# Patient Record
Sex: Female | Born: 1980 | Race: Black or African American | Hispanic: No | Marital: Single | Smoking: Never smoker
Health system: Southern US, Community
[De-identification: ages and names within clinical notes are randomized; demographics above are authoritative.]

## PROBLEM LIST (undated history)

## (undated) ENCOUNTER — Inpatient Hospital Stay (HOSPITAL_COMMUNITY): Payer: Self-pay

## (undated) DIAGNOSIS — N83209 Unspecified ovarian cyst, unspecified side: Secondary | ICD-10-CM

## (undated) DIAGNOSIS — R519 Headache, unspecified: Secondary | ICD-10-CM

## (undated) DIAGNOSIS — R51 Headache: Secondary | ICD-10-CM

## (undated) HISTORY — PX: LAPAROSCOPY: SHX197

---

## 2009-01-04 ENCOUNTER — Emergency Department (HOSPITAL_BASED_OUTPATIENT_CLINIC_OR_DEPARTMENT_OTHER): Admission: EM | Admit: 2009-01-04 | Discharge: 2009-01-04 | Payer: Self-pay | Admitting: Emergency Medicine

## 2009-05-04 ENCOUNTER — Emergency Department (HOSPITAL_BASED_OUTPATIENT_CLINIC_OR_DEPARTMENT_OTHER): Admission: EM | Admit: 2009-05-04 | Discharge: 2009-05-04 | Payer: Self-pay | Admitting: Emergency Medicine

## 2011-11-16 ENCOUNTER — Other Ambulatory Visit (HOSPITAL_COMMUNITY)
Admission: RE | Admit: 2011-11-16 | Discharge: 2011-11-16 | Disposition: A | Discharge status: Home / Self Care | Payer: Self-pay | Source: Ambulatory Visit | Attending: Obstetrics & Gynecology | Admitting: Obstetrics & Gynecology

## 2011-11-16 DIAGNOSIS — Z124 Encounter for screening for malignant neoplasm of cervix: Secondary | ICD-10-CM | POA: Insufficient documentation

## 2011-11-16 DIAGNOSIS — R87810 Cervical high risk human papillomavirus (HPV) DNA test positive: Secondary | ICD-10-CM | POA: Insufficient documentation

## 2011-12-17 ENCOUNTER — Emergency Department (HOSPITAL_BASED_OUTPATIENT_CLINIC_OR_DEPARTMENT_OTHER)
Admission: EM | Admit: 2011-12-17 | Discharge: 2011-12-17 | Disposition: A | Discharge status: Home / Self Care | Payer: Self-pay | Attending: Emergency Medicine | Admitting: Emergency Medicine

## 2011-12-17 ENCOUNTER — Encounter (HOSPITAL_BASED_OUTPATIENT_CLINIC_OR_DEPARTMENT_OTHER): Payer: Self-pay | Admitting: *Deleted

## 2011-12-17 DIAGNOSIS — R51 Headache: Secondary | ICD-10-CM | POA: Insufficient documentation

## 2011-12-17 HISTORY — DX: Headache, unspecified: R51.9

## 2011-12-17 HISTORY — DX: Headache: R51

## 2011-12-17 LAB — PREGNANCY, URINE: Preg Test, Ur: NEGATIVE

## 2011-12-17 MED ORDER — KETOROLAC TROMETHAMINE 30 MG/ML IJ SOLN
30.0000 mg | Freq: Once | INTRAMUSCULAR | Status: AC
  Administered 2011-12-17: 30 mg via INTRAVENOUS
  Filled 2011-12-17: qty 1

## 2011-12-17 MED ORDER — METOCLOPRAMIDE HCL 5 MG/ML IJ SOLN
10.0000 mg | Freq: Once | INTRAMUSCULAR | Status: AC
  Administered 2011-12-17: 10 mg via INTRAVENOUS
  Filled 2011-12-17: qty 2

## 2011-12-17 MED ORDER — SODIUM CHLORIDE 0.9 % IV SOLN
Freq: Once | INTRAVENOUS | Status: AC
  Administered 2011-12-17: 19:00:00 via INTRAVENOUS

## 2011-12-17 MED ORDER — DIPHENHYDRAMINE HCL 50 MG/ML IJ SOLN
25.0000 mg | Freq: Once | INTRAMUSCULAR | Status: AC
  Administered 2011-12-17: 25 mg via INTRAVENOUS
  Filled 2011-12-17: qty 1

## 2011-12-17 NOTE — ED Notes (Signed)
D/c home with ride- pt states she feels better, pain 0/10, caox 4

## 2011-12-17 NOTE — ED Provider Notes (Signed)
Medical screening examination/treatment/procedure(s) were performed by non-physician practitioner and as supervising physician I was immediately available for consultation/collaboration.   Hance Caspers R Zylee Marchiano, MD 12/17/11 2335 

## 2011-12-17 NOTE — ED Notes (Signed)
Pt states she has a hx of headaches and this one just started this a.m. "Nose burning and I feel like I'm going to pass out"

## 2011-12-17 NOTE — ED Notes (Signed)
Pt c/o severe headache with nausea, dizziness, and light sensitivity that started this am.  She states that she took a "Goody powder" at 1100 but it did not work.  Has a history of headaches.

## 2011-12-17 NOTE — ED Provider Notes (Signed)
History     CSN: [id number]  Arrival date & time 12/17/11  1732   First MD Initiated Contact with Patient 12/17/11 1817      Chief Complaint  Patient presents with  . Headache    (Consider location/radiation/quality/duration/timing/severity/associated sxs/prior treatment) Patient is a 31 y.o. female presenting with headaches. The history is provided by the patient. No language interpreter was used.  Headache  This is a new problem. The current episode started 6 to 12 hours ago. The problem occurs constantly. The problem has been gradually worsening. The headache is associated with nothing. The pain is at a severity of 6/10. The pain is moderate. The pain does not radiate. She has tried NSAIDs for the symptoms.  Pt complains of having a migrane.  Pt reports she has had in the past and responds well to the migrane cocktail that we give here  Past Medical History  Diagnosis Date  . Generalized headaches     Past Surgical History  Procedure Date  . Laparoscopy     History reviewed. No pertinent family history.  History  Substance Use Topics  . Smoking status: Never Smoker   . Smokeless tobacco: Not on file  . Alcohol Use: No    OB History    Grav Para Term Preterm Abortions TAB SAB Ect Mult Living                  Review of Systems  Neurological: Positive for headaches.  All other systems reviewed and are negative.    Allergies  Review of patient's allergies indicates no known allergies.  Home Medications   Current Outpatient Rx  Name Route Sig Dispense Refill  . IBUPROFEN 200 MG PO TABS Oral Take 600-800 mg by mouth every 8 (eight) hours as needed. For pain    . ADULT MULTIVITAMIN W/MINERALS CH Oral Take 1 tablet by mouth daily.      BP 132/82  Pulse 82  Temp 99.1 F (37.3 C) (Oral)  Resp 18  Ht 5\' 4"  (1.626 m)  Wt 126 lb (57.153 kg)  BMI 21.63 kg/m2  SpO2 99%  LMP 11/13/2011  Physical Exam  Nursing note and vitals reviewed. Constitutional: She  is oriented to person, place, and time. She appears well-developed and well-nourished.  HENT:  Head: Normocephalic and atraumatic.  Right Ear: External ear normal.  Left Ear: External ear normal.  Nose: Nose normal.  Mouth/Throat: Oropharynx is clear and moist.  Eyes: Conjunctivae and EOM are normal. Pupils are equal, round, and reactive to light.  Neck: Normal range of motion.  Cardiovascular: Normal rate and normal heart sounds.   Pulmonary/Chest: Effort normal.  Abdominal: Soft.  Musculoskeletal: Normal range of motion.  Neurological: She is alert and oriented to person, place, and time. She has normal reflexes.  Skin: Skin is warm.  Psychiatric: She has a normal mood and affect.    ED Course  Procedures (including critical care time)   Labs Reviewed  PREGNANCY, URINE   No results found.   1. Head ache       MDM  IV Ns x 1 liter,  Torodol, reglan and benadryl.   Pt reports complete resolution of symptoms        Lonia Skinner [place], [place] 12/17/11 2105

## 2012-06-15 ENCOUNTER — Inpatient Hospital Stay (HOSPITAL_COMMUNITY): Payer: BC Managed Care – PPO

## 2012-06-15 ENCOUNTER — Inpatient Hospital Stay (HOSPITAL_COMMUNITY)
Admission: AD | Admit: 2012-06-15 | Discharge: 2012-06-15 | Disposition: A | Discharge status: Home / Self Care | Payer: BC Managed Care – PPO | Source: Ambulatory Visit | Attending: Obstetrics and Gynecology | Admitting: Obstetrics and Gynecology

## 2012-06-15 ENCOUNTER — Encounter (HOSPITAL_COMMUNITY): Payer: Self-pay | Admitting: Obstetrics and Gynecology

## 2012-06-15 DIAGNOSIS — O4690 Antepartum hemorrhage, unspecified, unspecified trimester: Secondary | ICD-10-CM

## 2012-06-15 DIAGNOSIS — O209 Hemorrhage in early pregnancy, unspecified: Secondary | ICD-10-CM | POA: Insufficient documentation

## 2012-06-15 LAB — URINALYSIS, ROUTINE W REFLEX MICROSCOPIC
Ketones, ur: NEGATIVE mg/dL
Leukocytes, UA: NEGATIVE
Nitrite: NEGATIVE
Protein, ur: NEGATIVE mg/dL
pH: 6.5 (ref 5.0–8.0)

## 2012-06-15 LAB — CBC WITH DIFFERENTIAL/PLATELET
HCT: 35 % — ABNORMAL LOW (ref 36.0–46.0)
Hemoglobin: 11.4 g/dL — ABNORMAL LOW (ref 12.0–15.0)
Lymphocytes Relative: 31 % (ref 12–46)
Monocytes Absolute: 0.6 10*3/uL (ref 0.1–1.0)
Monocytes Relative: 10 % (ref 3–12)
Neutro Abs: 3.5 10*3/uL (ref 1.7–7.7)
RBC: 4.97 MIL/uL (ref 3.87–5.11)
WBC: 6 10*3/uL (ref 4.0–10.5)

## 2012-06-15 LAB — ABO/RH: ABO/RH(D): A POS

## 2012-06-15 NOTE — MAU Note (Signed)
Elizabeth Charles is here today with complaints of vaginal bleeding and abdominal cramping. She is [redacted] weeks pregnant and symptoms started this morning at 0600. The bleeding scant; she has a liner on that is currently clean. Its more liquid and pink than gushing of blood. She rates her pain a 7/10.

## 2012-06-15 NOTE — MAU Provider Note (Signed)
History     CSN: [id number]  Arrival date and time: 06/15/12 4098   First Provider Initiated Contact with Patient 06/15/12 [phone]      Chief Complaint  Patient presents with  . Vaginal Bleeding  . Abdominal Cramping   HPI  Elizabeth Charles is 32 y.o. [phone] [redacted]w[redacted]d weeks presenting with vaginal bleeding described as pink, noticed this am.  She denies flow of blood or clots.  She has been seen in Dr. Kathi Der office with ultrasound that showed IUG but too early to see cardiac activity per patient report.  She reports intermmitent abdominal cramping rating it a 7/10.  Dr. Dion Body is aware she is here.  The patient reports having had cultures done in the office--will not repeat today.  Past Medical History  Diagnosis Date  . Generalized headaches     Past Surgical History  Procedure Laterality Date  . Laparoscopy      History reviewed. No pertinent family history.  History  Substance Use Topics  . Smoking status: Never Smoker   . Smokeless tobacco: Not on file  . Alcohol Use: No    Allergies: No Known Allergies  Prescriptions prior to admission  Medication Sig Dispense Refill  . acetaminophen (TYLENOL) 500 MG tablet Take 1,000 mg by mouth every 6 (six) hours as needed for pain.      . Prenatal Vit-Fe Fumarate-FA (PRENATAL MULTIVITAMIN) TABS Take 1 tablet by mouth daily.        Review of Systems  Constitutional: Negative.   HENT: Negative.   Respiratory: Negative.   Cardiovascular: Negative.   Gastrointestinal: Positive for abdominal pain (intermittent cramping).  Genitourinary:       + for pink- blood this am   Physical Exam   Blood pressure 129/88, pulse 97, temperature 98.2 F (36.8 C), temperature source Oral, resp. rate 18, weight 128 lb (58.06 kg), last menstrual period 04/30/2012.  Physical Exam  Constitutional: She is oriented to person, place, and time. She appears well-developed and well-nourished. No distress.  HENT:  Head: Normocephalic.  Neck: Normal  range of motion.  Cardiovascular: Normal rate.   Respiratory: Effort normal.  GI: Soft. She exhibits no distension and no mass. There is no tenderness. There is no rebound and no guarding.  Genitourinary: There is no rash, tenderness or lesion on the right labia. There is no rash, tenderness or lesion on the left labia. Uterus is enlarged (slightly). Uterus is not tender. Cervix exhibits no discharge and no friability. Right adnexum displays no mass, no tenderness and no fullness. Left adnexum displays no mass, no tenderness and no fullness. No erythema or bleeding around the vagina. Vaginal discharge (clear discharge without odor ) found.  Neurological: She is alert and oriented to person, place, and time.  Skin: Skin is warm and dry.  Psychiatric: She has a normal mood and affect. Her behavior is normal. Thought content normal.   Results for orders placed during the hospital encounter of 06/15/12 (from the past 24 hour(s))  URINALYSIS, ROUTINE W REFLEX MICROSCOPIC     Status: Abnormal   Collection Time    06/15/12  9:11 AM      Result Value Range   Color, Urine YELLOW  YELLOW   APPearance CLEAR  CLEAR   Specific Gravity, Urine <1.005 (*) 1.005 - 1.030   pH 6.5  5.0 - 8.0   Glucose, UA NEGATIVE  NEGATIVE mg/dL   Hgb urine dipstick LARGE (*) NEGATIVE   Bilirubin Urine NEGATIVE  NEGATIVE   Ketones,  ur NEGATIVE  NEGATIVE mg/dL   Protein, ur NEGATIVE  NEGATIVE mg/dL   Urobilinogen, UA 0.2  0.0 - 1.0 mg/dL   Nitrite NEGATIVE  NEGATIVE   Leukocytes, UA NEGATIVE  NEGATIVE  URINE MICROSCOPIC-ADD ON     Status: Abnormal   Collection Time    06/15/12  9:11 AM      Result Value Range   Squamous Epithelial / LPF MANY (*) RARE   WBC, UA 0-2  <3 WBC/hpf   RBC / HPF 3-6  <3 RBC/hpf   Bacteria, UA RARE  RARE  POCT PREGNANCY, URINE     Status: Abnormal   Collection Time    06/15/12  9:16 AM      Result Value Range   Preg Test, Ur POSITIVE (*) NEGATIVE  HCG, QUANTITATIVE, PREGNANCY     Status:  Abnormal   Collection Time    06/15/12  9:43 AM      Result Value Range   hCG, Beta Francene Finders [id number] (*) <5 mIU/mL  ABO/RH     Status: None   Collection Time    06/15/12  9:43 AM      Result Value Range   ABO/RH(D) A POS    CBC WITH DIFFERENTIAL     Status: Abnormal   Collection Time    06/15/12  9:43 AM      Result Value Range   WBC 6.0  4.0 - 10.5 K/uL   RBC 4.97  3.87 - 5.11 MIL/uL   Hemoglobin 11.4 (*) 12.0 - 15.0 g/dL   HCT 09.8 (*) 11.9 - 14.7 %   MCV 70.4 (*) 78.0 - 100.0 fL   MCH 22.9 (*) 26.0 - 34.0 pg   MCHC 32.6  30.0 - 36.0 g/dL   RDW 82.9  56.2 - 13.0 %   Platelets 228  150 - 400 K/uL   Neutrophils Relative 58  43 - 77 %   Neutro Abs 3.5  1.7 - 7.7 K/uL   Lymphocytes Relative 31  12 - 46 %   Lymphs Abs 1.9  0.7 - 4.0 K/uL   Monocytes Relative 10  3 - 12 %   Monocytes Absolute 0.6  0.1 - 1.0 K/uL   Eosinophils Relative 1  0 - 5 %   Eosinophils Absolute 0.1  0.0 - 0.7 K/uL   Basophils Relative 1  0 - 1 %   Basophils Absolute 0.0  0.0 - 0.1 K/uL   Clinical Data: Bleeding at 6 weeks 4 days gestation.  OBSTETRIC <14 WK ULTRASOUND  Technique: Transabdominal ultrasound was performed for evaluation  of the gestation as well as the maternal uterus and adnexal  regions.  Comparison: None.  Intrauterine gestational sac: Visualized/normal in shape.  Yolk sac: Present  Embryo: Present  Cardiac Activity: Present  Heart Rate: 133 bpm  CRL: 1.20 mm 7 w 3 d [place] EDC: 02/08/2013  Maternal uterus/Adnexae:  No evidence of subchorionic hemorrhage. There is irregularity  along the choriodecidual reaction, suspicious for a "chorionic  bump." This measures 1.8 cm maximally on image 14.  Right small ovarian corpus luteal cyst. Normal left ovary. No  significant free fluid.  IMPRESSION:  1. Intrauterine pregnancy of approximately 7 weeks 3 days with  fetal heart rate of 133 beats per minute.  2. Irregularity along the periphery of the gestational sac,  suspicious for a  "chorionic bump."  Original Report Authenticated By: Jeronimo Greaves, M.D.     MAU Course  Procedures  Patient reports cultures  have been done in office-will not repeat today.  MDM 9:30  Dr Dion Body called asking about patient.  Will call when labs/ultrasound are completed and reported. 10:54  Paged Dr. Dion Body.  Ultrasound and labs reported as well as physical exam finding.  Order given to have patient follow pelvic rest until seen in the office, keep scheduled appointment and call Dr. Christell Constant if bleeding increases   I reviewed findings with the patient.   Assessment and Plan  A:  Bleeding in early pregnancy     Viable Intrauterine pregnancy [redacted]w[redacted]d     Irregularity along periphery of gestational sac, suspicious for chorionic bump  P:  Patient instructed to have strict pelvic rest until all bleeding stops     Keep scheduled appointment in the office and call if bleeding increases.    Adriona Kaney,EVE M 06/15/2012, 10:35 AM

## 2013-04-20 ENCOUNTER — Encounter (HOSPITAL_COMMUNITY): Payer: Self-pay | Admitting: *Deleted

## 2014-02-23 ENCOUNTER — Encounter (HOSPITAL_COMMUNITY): Payer: Self-pay | Admitting: *Deleted

## 2014-02-26 IMAGING — US US OB COMP LESS 14 WK
1 series · 14 of 23 positions shown · non-contrast
Comparison: None.

CLINICAL DATA: Bleeding at 6 weeks 4 days gestation.

OBSTETRIC <14 WK ULTRASOUND
TECHNIQUE: Transabdominal ultrasound was performed for evaluation
of the gestation as well as the maternal uterus and adnexal
regions.

[Series 1: us ob comp less 14 wks · 23 acquisitions, 14 frames shown]
[im 1/23]
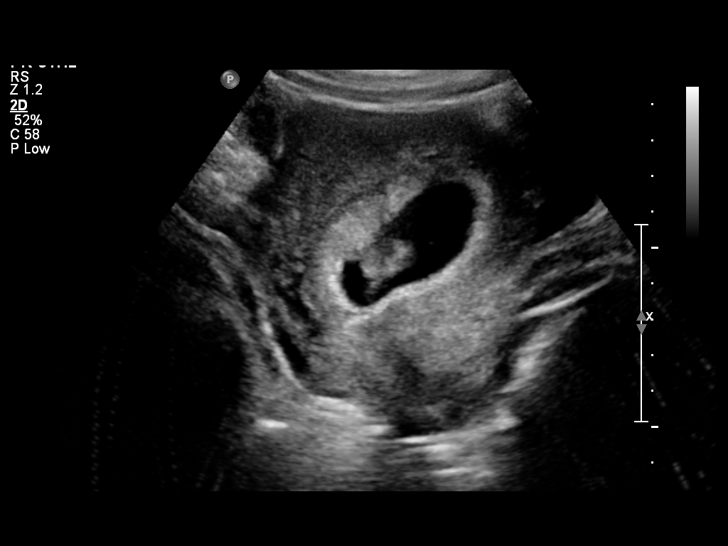
[im 3/23]
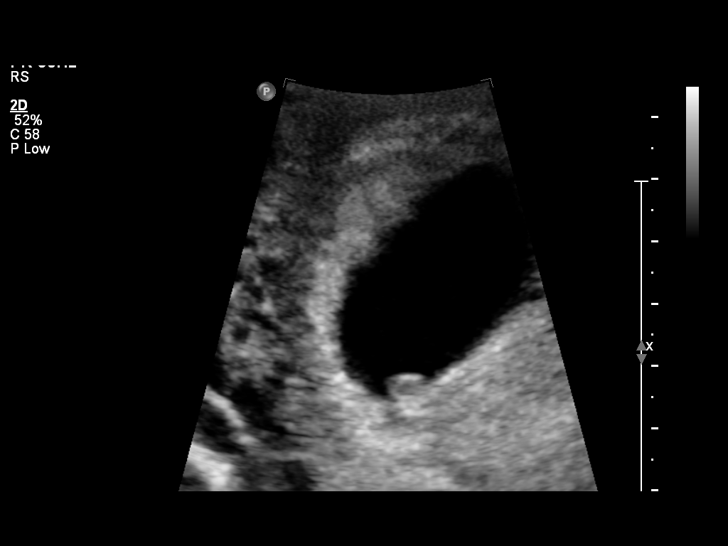
[im 5/23]
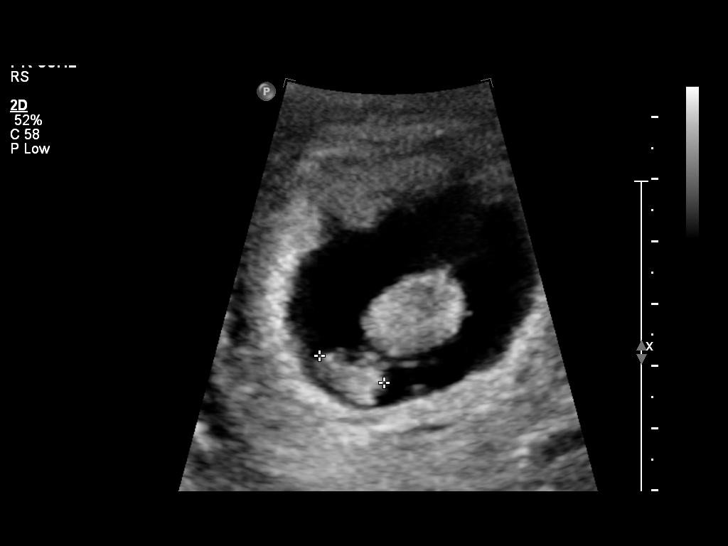
[im 6/23]
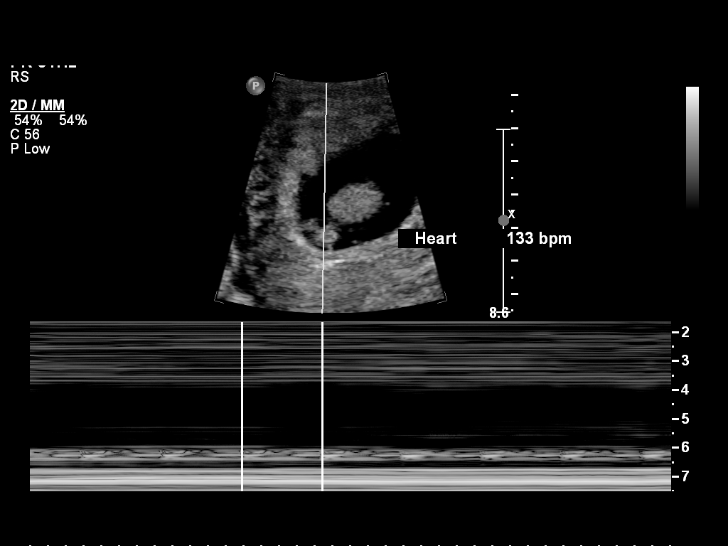
[im 8/23]
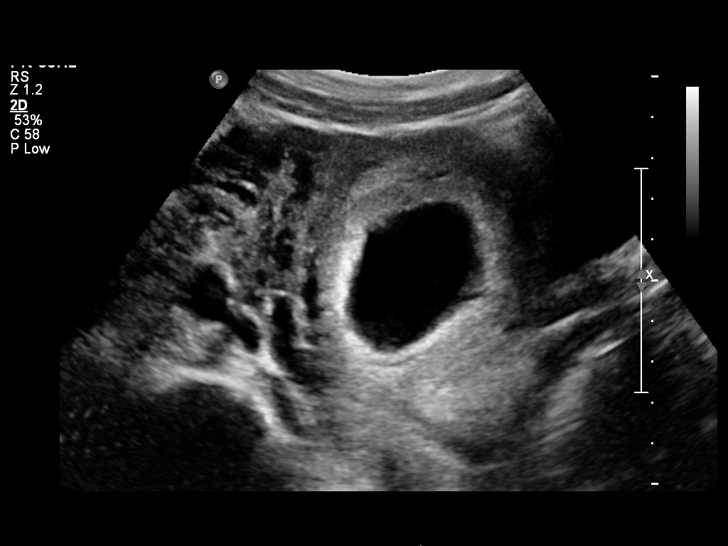
[im 10/23]
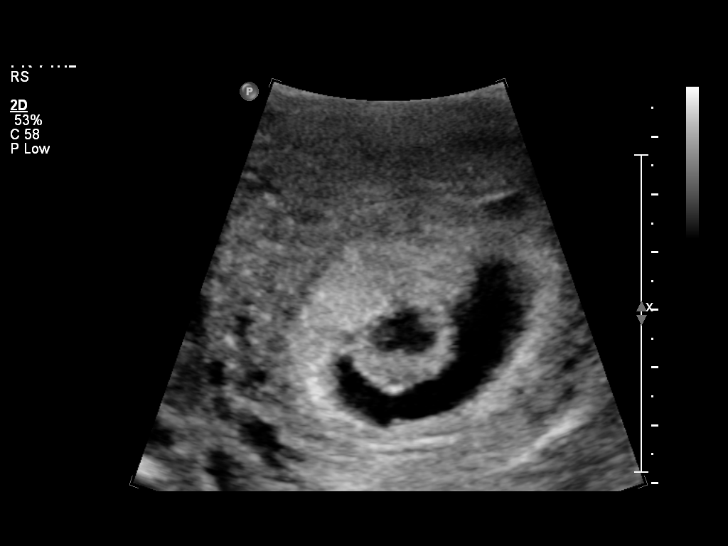
[im 11/23]
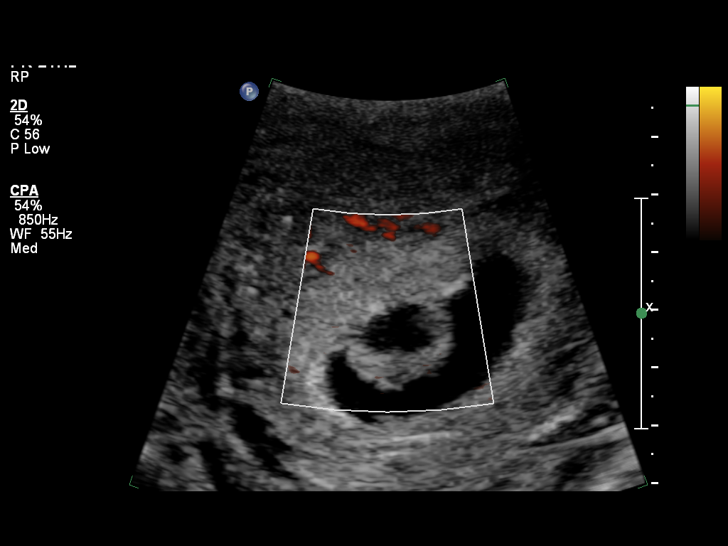
[im 13/23]
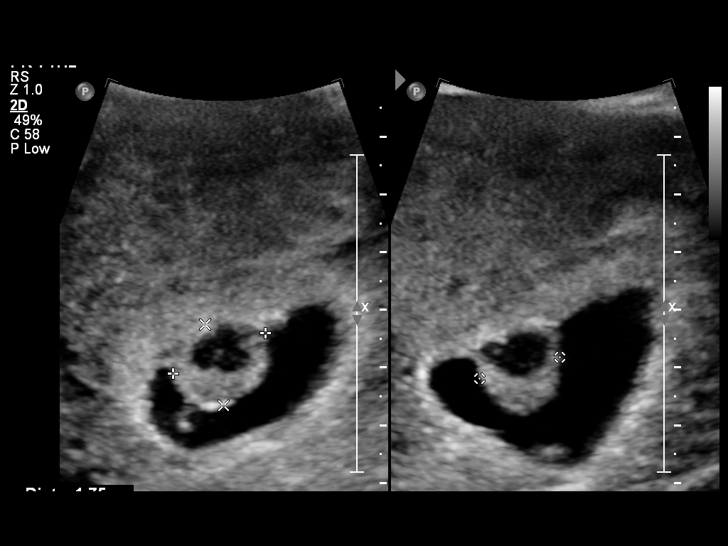
[im 14/23]
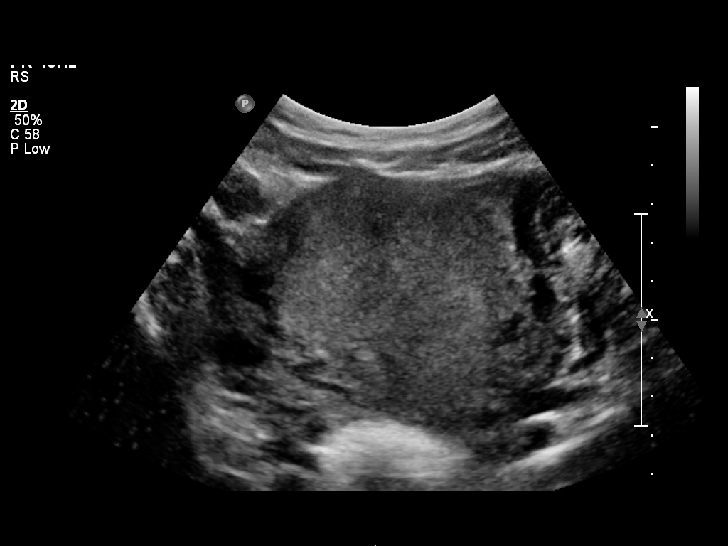
[im 16/23]
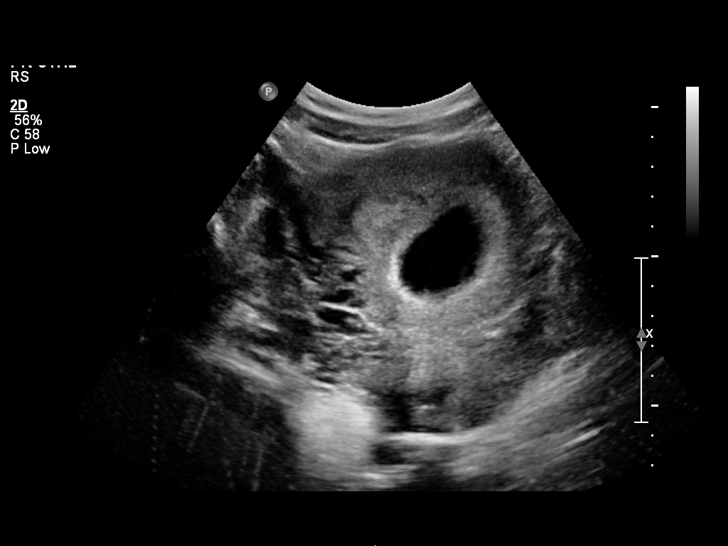
[im 18/23]
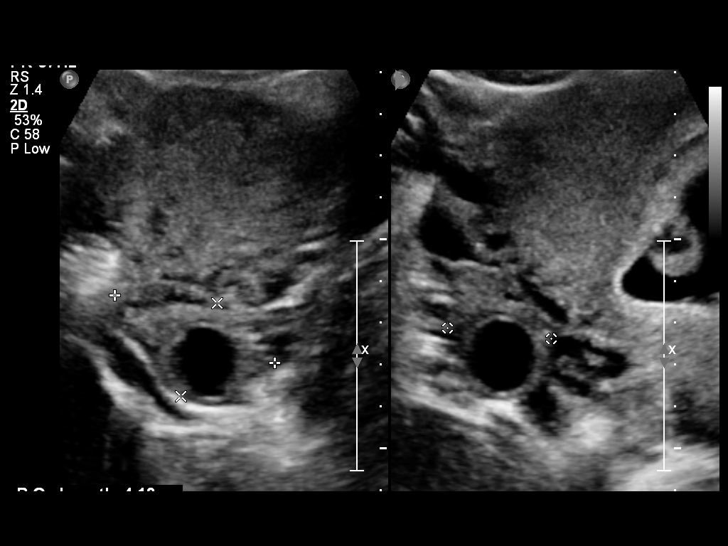
[im 19/23]
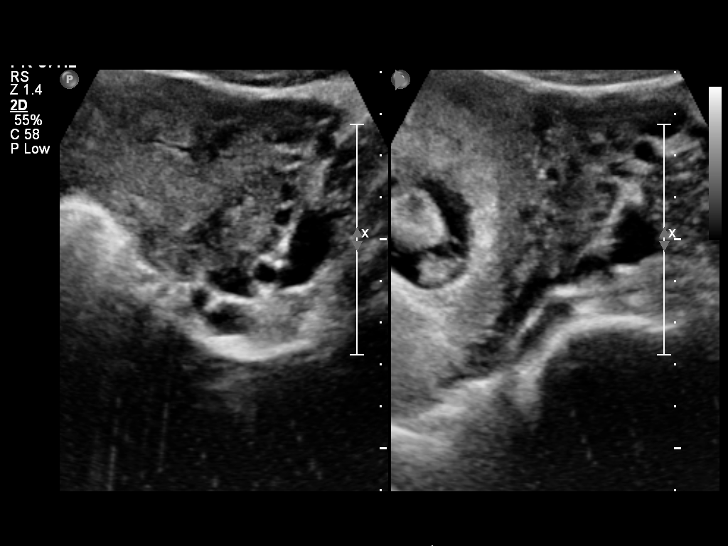
[im 21/23]
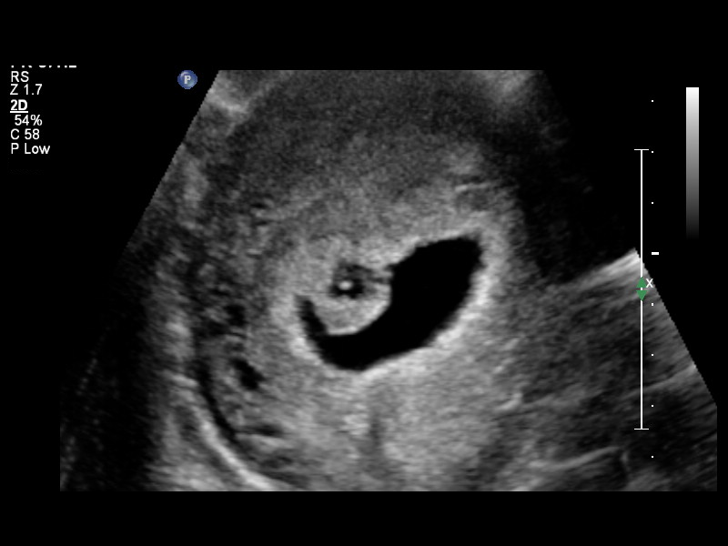
[im 23/23]
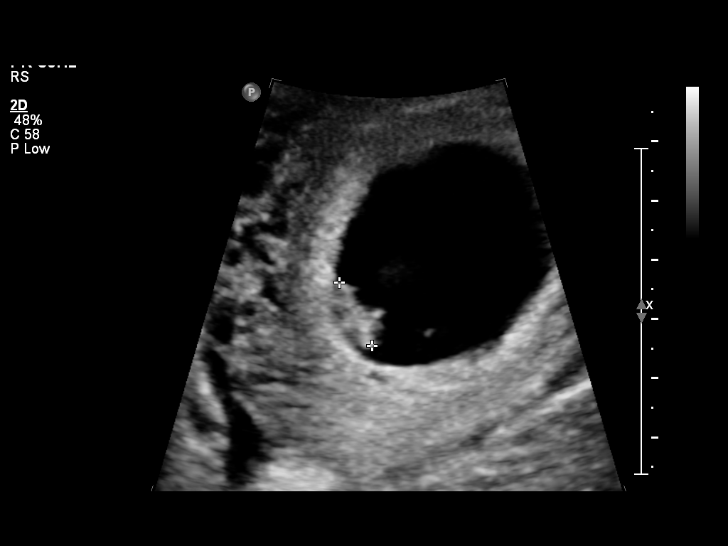

[14 of 23 positions shown; findings below may reference images not displayed]

Intrauterine gestational sac: Visualized/normal in shape.
Yolk sac: Present
Embryo: Present
Cardiac Activity: Present
Heart Rate: 133 bpm

CRL:  1.20 mm  7 w  3 d       US EDC: 02/08/2013

Maternal uterus/Adnexae:
No evidence of subchorionic hemorrhage.  There is irregularity
along the choriodecidual reaction, suspicious for a "chorionic
bump."  This measures 1.8 cm maximally on image 14.

Right small ovarian corpus luteal cyst.  Normal left ovary. No
significant free fluid.
IMPRESSION: 1.  Intrauterine pregnancy of approximately 7 weeks 3 days with
fetal heart rate of 133 beats per minute.
2.  Irregularity along the periphery of the gestational sac,
suspicious for a "chorionic bump."

## 2014-03-12 ENCOUNTER — Encounter (HOSPITAL_BASED_OUTPATIENT_CLINIC_OR_DEPARTMENT_OTHER): Payer: Self-pay | Admitting: Emergency Medicine

## 2014-03-12 ENCOUNTER — Emergency Department (HOSPITAL_BASED_OUTPATIENT_CLINIC_OR_DEPARTMENT_OTHER)
Admission: EM | Admit: 2014-03-12 | Discharge: 2014-03-12 | Disposition: A | Discharge status: Home / Self Care | Payer: BC Managed Care – PPO | Attending: Emergency Medicine | Admitting: Emergency Medicine

## 2014-03-12 DIAGNOSIS — Z79899 Other long term (current) drug therapy: Secondary | ICD-10-CM | POA: Insufficient documentation

## 2014-03-12 DIAGNOSIS — Y998 Other external cause status: Secondary | ICD-10-CM | POA: Diagnosis not present

## 2014-03-12 DIAGNOSIS — M62838 Other muscle spasm: Secondary | ICD-10-CM | POA: Diagnosis not present

## 2014-03-12 DIAGNOSIS — S199XXA Unspecified injury of neck, initial encounter: Secondary | ICD-10-CM | POA: Diagnosis present

## 2014-03-12 DIAGNOSIS — Y92410 Unspecified street and highway as the place of occurrence of the external cause: Secondary | ICD-10-CM | POA: Insufficient documentation

## 2014-03-12 DIAGNOSIS — Y9389 Activity, other specified: Secondary | ICD-10-CM | POA: Diagnosis not present

## 2014-03-12 DIAGNOSIS — S161XXA Strain of muscle, fascia and tendon at neck level, initial encounter: Secondary | ICD-10-CM | POA: Diagnosis not present

## 2014-03-12 MED ORDER — IBUPROFEN 800 MG PO TABS: 800.0000 mg | ORAL_TABLET | Freq: Three times a day (TID) | ORAL | Status: AC

## 2014-03-12 MED ORDER — HYDROCODONE-ACETAMINOPHEN 5-325 MG PO TABS: 1.0000 | ORAL_TABLET | ORAL | Status: AC | PRN

## 2014-03-12 MED ORDER — DIAZEPAM 5 MG PO TABS: 5.0000 mg | ORAL_TABLET | Freq: Two times a day (BID) | ORAL | Status: AC
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
## 2019-02-19 ENCOUNTER — Encounter (HOSPITAL_BASED_OUTPATIENT_CLINIC_OR_DEPARTMENT_OTHER): Payer: Self-pay

## 2019-02-19 ENCOUNTER — Emergency Department (HOSPITAL_BASED_OUTPATIENT_CLINIC_OR_DEPARTMENT_OTHER)
Admission: EM | Admit: 2019-02-19 | Discharge: 2019-02-19 | Disposition: A | Discharge status: Home / Self Care | Payer: PRIVATE HEALTH INSURANCE | Attending: Emergency Medicine | Admitting: Emergency Medicine

## 2019-02-19 ENCOUNTER — Other Ambulatory Visit: Payer: Self-pay

## 2019-02-19 ENCOUNTER — Emergency Department (HOSPITAL_BASED_OUTPATIENT_CLINIC_OR_DEPARTMENT_OTHER): Payer: PRIVATE HEALTH INSURANCE

## 2019-02-19 DIAGNOSIS — Z79899 Other long term (current) drug therapy: Secondary | ICD-10-CM | POA: Insufficient documentation

## 2019-02-19 DIAGNOSIS — R0789 Other chest pain: Secondary | ICD-10-CM | POA: Diagnosis present

## 2019-02-19 HISTORY — DX: Unspecified ovarian cyst, unspecified side: N83.209

## 2019-02-19 LAB — CBC
HCT: 39.3 % (ref 36.0–46.0)
Hemoglobin: 12 g/dL (ref 12.0–15.0)
MCH: 22.8 pg — ABNORMAL LOW (ref 26.0–34.0)
MCHC: 30.5 g/dL (ref 30.0–36.0)
MCV: 74.6 fL — ABNORMAL LOW (ref 80.0–100.0)
Platelets: 281 10*3/uL (ref 150–400)
RBC: 5.27 MIL/uL — ABNORMAL HIGH (ref 3.87–5.11)
RDW: 15.7 % — ABNORMAL HIGH (ref 11.5–15.5)
WBC: 7.1 10*3/uL (ref 4.0–10.5)
nRBC: 0 % (ref 0.0–0.2)

## 2019-02-19 LAB — BASIC METABOLIC PANEL
Anion gap: 9 (ref 5–15)
BUN: 14 mg/dL (ref 6–20)
CO2: 25 mmol/L (ref 22–32)
Calcium: 8.8 mg/dL — ABNORMAL LOW (ref 8.9–10.3)
Chloride: 104 mmol/L (ref 98–111)
Creatinine, Ser: 0.72 mg/dL (ref 0.44–1.00)
GFR calc Af Amer: >60 mL/min (ref >60)
GFR calc non Af Amer: >60 mL/min (ref >60)
Glucose, Bld: 96 mg/dL (ref 70–99)
Potassium: 3.3 mmol/L — ABNORMAL LOW (ref 3.5–5.1)
Sodium: 138 mmol/L (ref 135–145)

## 2019-02-19 LAB — TROPONIN I (HIGH SENSITIVITY)
Troponin I (High Sensitivity): 2 ng/L (ref <18)
Troponin I (High Sensitivity): 3 ng/L

## 2019-02-19 LAB — PREGNANCY, URINE: Preg Test, Ur: NEGATIVE

## 2019-02-19 MED ORDER — SODIUM CHLORIDE 0.9% FLUSH
3.0000 mL | Freq: Once | INTRAVENOUS | Status: DC
  Filled 2019-02-19: qty 3

## 2019-02-19 MED ORDER — KETOROLAC TROMETHAMINE 30 MG/ML IJ SOLN
30.0000 mg | Freq: Once | INTRAMUSCULAR | Status: AC
  Administered 2019-02-19: 30 mg via INTRAVENOUS
  Filled 2019-02-19: qty 1

## 2019-02-19 NOTE — ED Provider Notes (Signed)
[facility] EMERGENCY DEPARTMENT Provider Note   CSN: [id number] Arrival date & time: 02/19/19  1922     History   Chief Complaint Chief Complaint  Patient presents with  . Chest Pain    HPI Elizabeth Charles is a 38 y.o. female.     Patient is a 38 year old female with no significant past medical history.  She presents today with complaints of chest discomfort.  She describes a steady pressure in her chest for the past week with no associated shortness of breath, nausea, diaphoresis, or radiation.  There are no aggravating or alleviating factors.  Patient denies fevers, chills, or cough.  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  She was just tested this past Sunday and was negative.  The history is provided by the patient.  Chest Pain Pain location:  Substernal area Pain quality: pressure   Pain radiates to:  Does not radiate Pain severity:  Mild Onset quality:  Gradual Duration:  7 days Timing:  Constant Progression:  Unchanged Chronicity:  New Relieved by:  Nothing Worsened by:  Nothing Ineffective treatments:  None tried   Past Medical History:  Diagnosis Date  . Generalized headaches   . Ovarian cyst     There are no active problems to display for this patient.   Past Surgical History:  Procedure Laterality Date  . LAPAROSCOPY       OB History    Gravida  5   Para  2   Term  2   Preterm  0   AB  2   Living  2     SAB      TAB  2   Ectopic      Multiple      Live Births  2            Home Medications    Prior to Admission medications   Medication Sig Start Date End Date Taking? Authorizing Provider  acetaminophen (TYLENOL) 500 MG tablet Take 1,000 mg by mouth every 6 (six) hours as needed for pain.    [provider]  diazepam (VALIUM) 5 MG tablet Take 1 tablet (5 mg total) by mouth 2 (two) times daily. 03/12/14   Hess, Nada Boozer, PA-C   HYDROcodone-acetaminophen (NORCO/VICODIN) 5-325 MG per tablet Take 1-2 tablets by mouth every 4 (four) hours as needed. 03/12/14   Hess, Nada Boozer, PA-C  ibuprofen (ADVIL,MOTRIN) 800 MG tablet Take 1 tablet (800 mg total) by mouth 3 (three) times daily. 03/12/14   Hess, Nada Boozer, PA-C  Prenatal Vit-Fe Fumarate-FA (PRENATAL MULTIVITAMIN) TABS Take 1 tablet by mouth daily.    [provider]    Family History No family history on file.  Social History Social History   Tobacco Use  . Smoking status: Never Smoker  . Smokeless tobacco: Never Used  Substance Use Topics  . Alcohol use: No  . Drug use: No     Allergies   Patient has no known allergies.   Review of Systems Review of Systems  Cardiovascular: Positive for chest pain.  All other systems reviewed and are negative.    Physical Exam Updated Vital Signs BP (!) 145/91 (BP Location: Right Arm)   Pulse 90   Temp 99.1 F (37.3 C) (Oral)   Resp 16   Ht 5\' 3"  (1.6 m)   Wt 65.8 kg   LMP 02/12/2019   SpO2 99%   BMI  25.69 kg/m   Physical Exam Vitals signs and nursing note reviewed.  Constitutional:      General: She is not in acute distress.    Appearance: She is well-developed. She is not diaphoretic.  HENT:     Head: Normocephalic and atraumatic.  Neck:     Musculoskeletal: Normal range of motion and neck supple.  Cardiovascular:     Rate and Rhythm: Normal rate and regular rhythm.     Heart sounds: No murmur. No friction rub. No gallop.   Pulmonary:     Effort: Pulmonary effort is normal. No respiratory distress.     Breath sounds: Normal breath sounds. No wheezing.  Abdominal:     General: Bowel sounds are normal. There is no distension.     Palpations: Abdomen is soft.     Tenderness: There is no abdominal tenderness.  Musculoskeletal: Normal range of motion.     Right lower leg: She exhibits no tenderness. No edema.     Left lower leg: She exhibits no tenderness. No edema.  Skin:    General:  Skin is warm and dry.  Neurological:     Mental Status: She is alert and oriented to person, place, and time.      ED Treatments / Results  Labs (all labs ordered are listed, but only abnormal results are displayed) Labs Reviewed  CBC - Abnormal; Notable for the following components:      Result Value   RBC 5.27 (*)    MCV 74.6 (*)    MCH 22.8 (*)    RDW 15.7 (*)    All other components within normal limits  BASIC METABOLIC PANEL  PREGNANCY, URINE  TROPONIN I (HIGH SENSITIVITY)    EKG EKG Interpretation  Date/Time:  Wednesday February 19 2019 19:32:19 EDT Ventricular Rate:  90 PR Interval:  142 QRS Duration: 94 QT Interval:  364 QTC Calculation: 445 R Axis:   59 Text Interpretation: Normal sinus rhythm Incomplete right bundle branch block Borderline ECG Confirmed by Veryl Speak [phone]) on 02/19/2019 8:12:18 PM   Radiology Dg Chest 2 View  Result Date: 02/19/2019 CLINICAL DATA:  Chest pressure for 1 week, dry cough EXAM: CHEST - 2 VIEW COMPARISON:  None. FINDINGS: No consolidation, features of edema, pneumothorax, or effusion. Pulmonary vascularity is normally distributed. The cardiomediastinal contours are unremarkable. No acute osseous or soft tissue abnormality. IMPRESSION: No acute cardiopulmonary abnormality. Electronically Signed   By: Lovena Le M.D.   On: 02/19/2019 20:17    Procedures Procedures (including critical care time)  Medications Ordered in ED Medications  sodium chloride flush (NS) 0.9 % injection 3 mL (3 mLs Intravenous Not Given 02/19/19 2028)     Initial Impression / Assessment and Plan / ED Course  I have reviewed the triage vital signs and the nursing notes.  Pertinent labs & imaging results that were available during my care of the patient were reviewed by me and considered in my medical decision making (see chart for details).  Patient presents here with complaints of chest discomfort.  She describes a steady heavy sensation to the  front of her chest for the past week.  There are no aggravating or alleviating factors.  She denies to me she is experiencing any fevers, chills, or cough.  Patient's work-up today reveals a normal EKG and negative troponin x2.  Her chest x-ray is clear and remainder of the work-up is unremarkable.  At this point, I see no indication for further work-up.  Patient to  be discharged with follow-up with primary doctor.  She will be started on an anti-inflammatory in case this represents some sort of costochondritis or inflammatory process.  Final Clinical Impressions(s) / ED Diagnoses   Final diagnoses:  None    ED Discharge Orders    None       Geoffery Lyonselo, Mitesh Rosendahl, MD 02/19/19 2233

## 2019-02-19 NOTE — ED Notes (Signed)
ED Provider at bedside. 

## 2019-02-19 NOTE — Discharge Instructions (Addendum)
Ibuprofen 600 mg 3 times daily for the next several days.  Follow-up with your primary doctor if symptoms do not improve in the next few days, and return to the ER if symptoms significantly worsen or change.
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
## 2020-11-01 IMAGING — DX DG CHEST 2V
2 series · 2 of 2 positions shown · non-contrast
Comparison: None.

CLINICAL DATA: Chest pressure for 1 week, dry cough

EXAM:
CHEST - 2 VIEW

[chest pa]
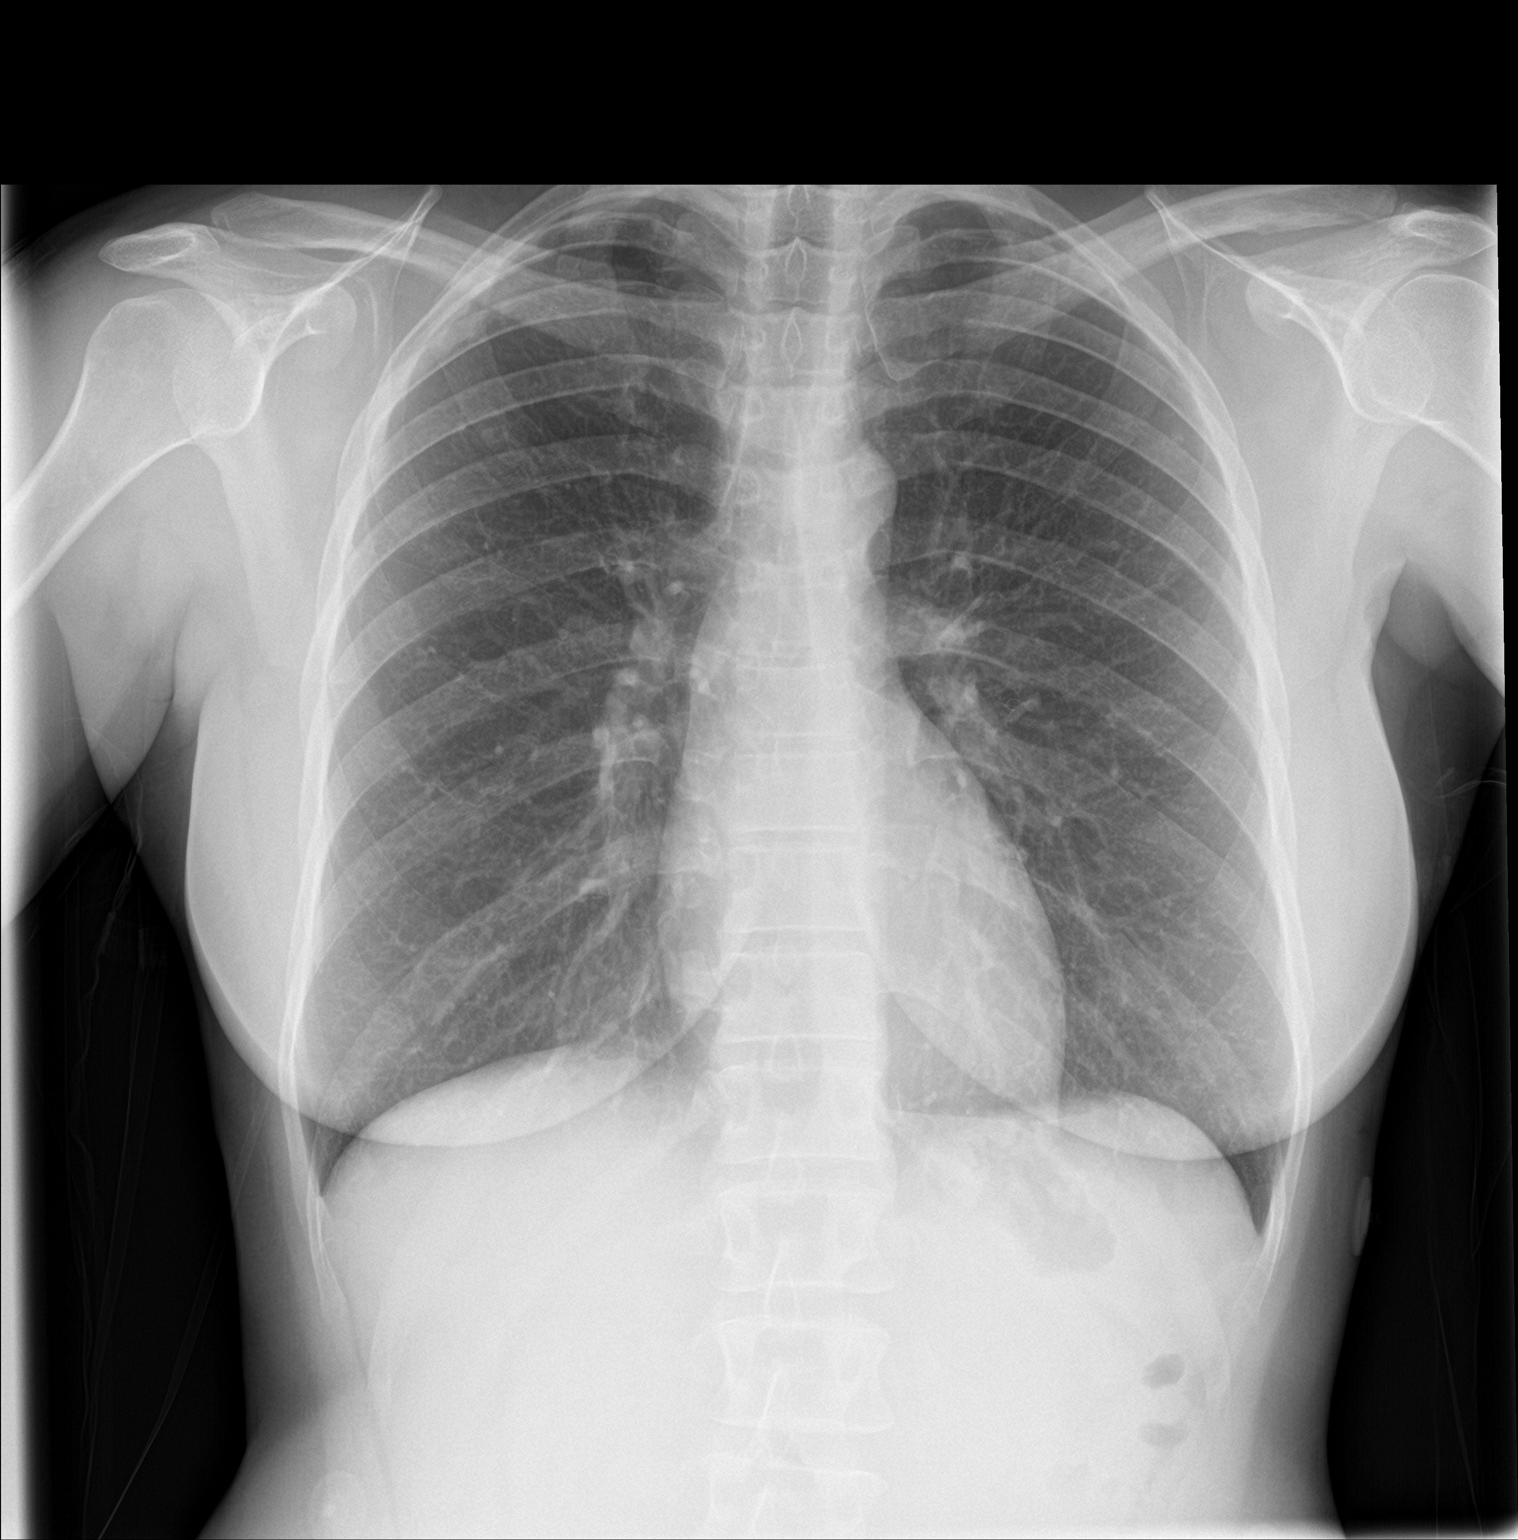

[chest lat]
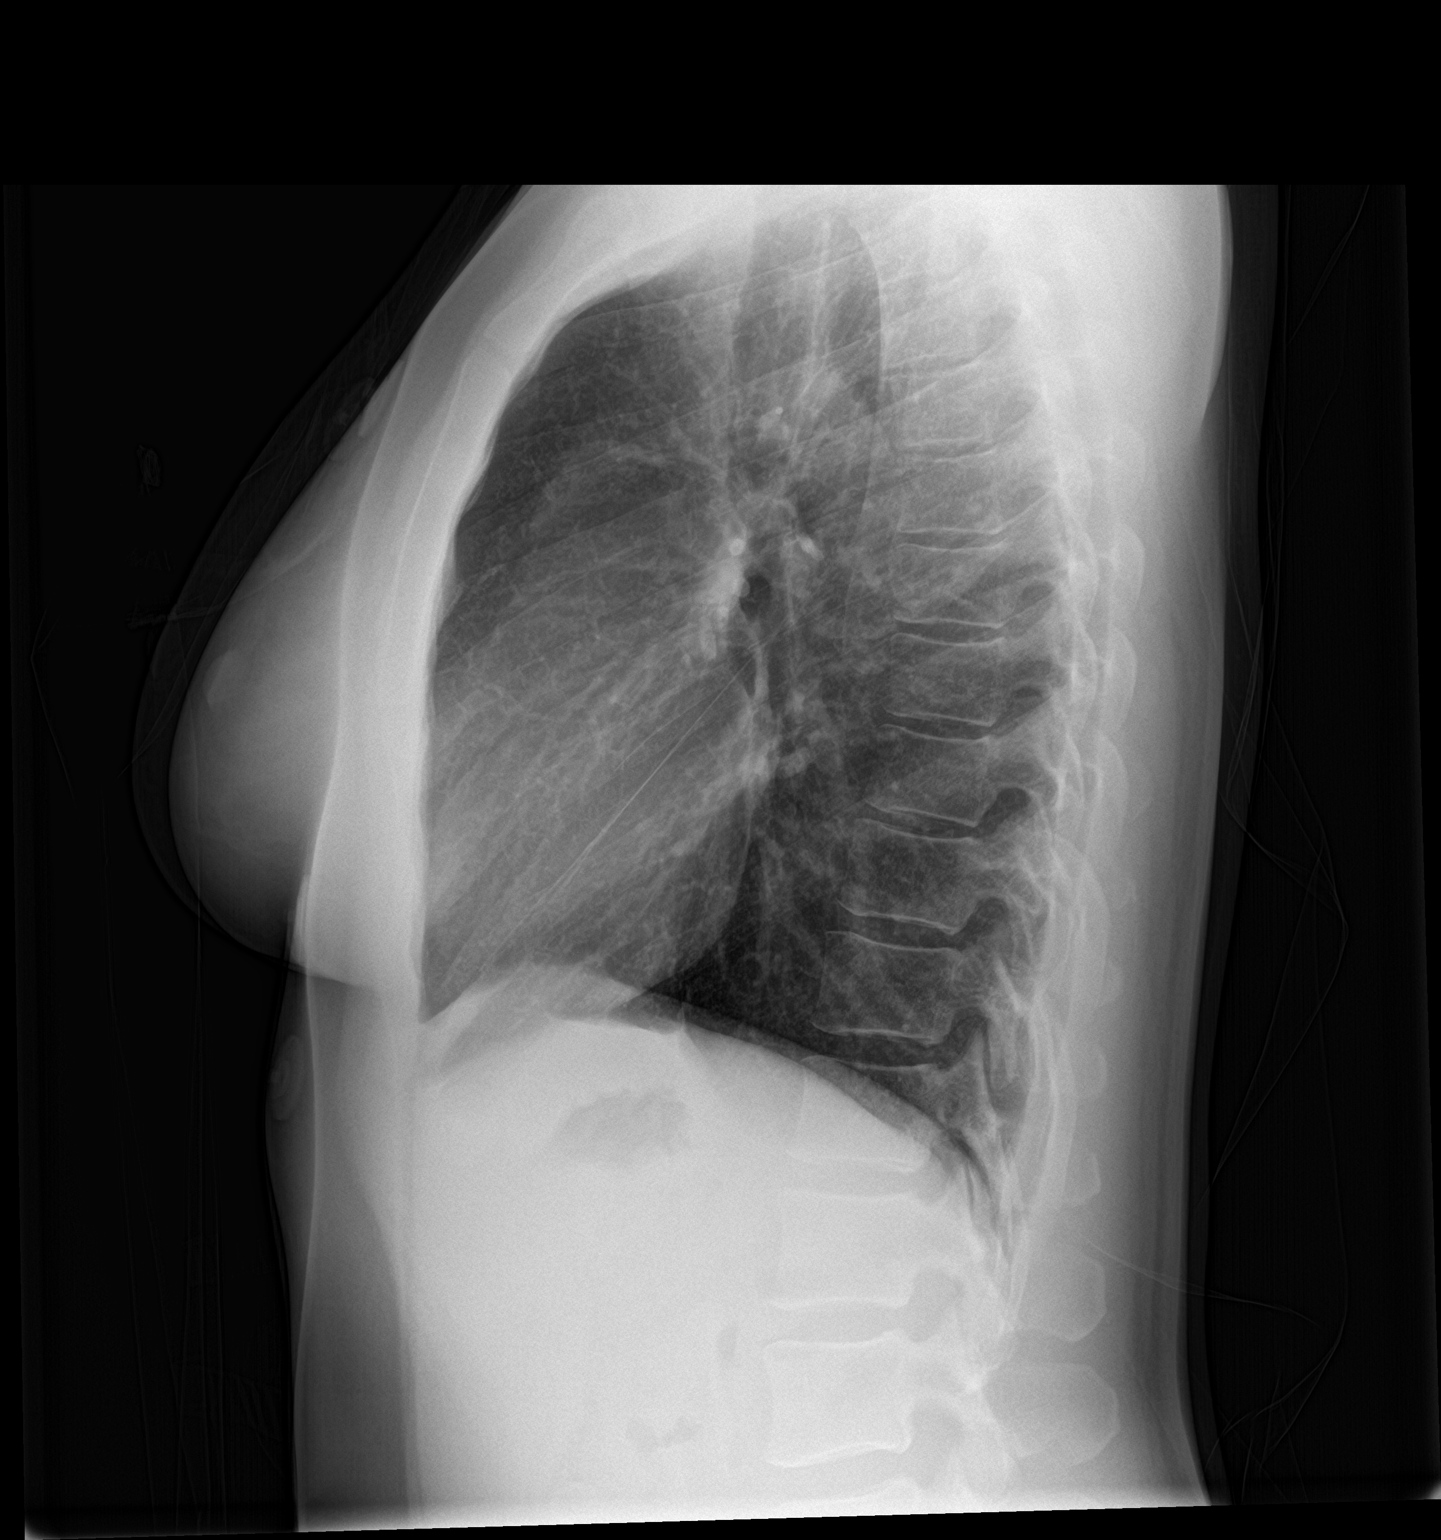

[2 of 2 positions shown; findings below may reference images not displayed]

FINDINGS: No consolidation, features of edema, pneumothorax, or effusion.
Pulmonary vascularity is normally distributed. The cardiomediastinal
contours are unremarkable. No acute osseous or soft tissue
abnormality.
IMPRESSION: No acute cardiopulmonary abnormality.
